# Patient Record
Sex: Female | Born: 1980 | Race: White | Hispanic: No | Marital: Single | Smoking: Never smoker
Health system: Southern US, Community
[De-identification: ages and names within clinical notes are randomized; demographics above are authoritative.]

## PROBLEM LIST (undated history)

## (undated) DIAGNOSIS — F909 Attention-deficit hyperactivity disorder, unspecified type: Secondary | ICD-10-CM

## (undated) DIAGNOSIS — N200 Calculus of kidney: Secondary | ICD-10-CM

## (undated) HISTORY — PX: OTHER SURGICAL HISTORY: SHX169

## (undated) HISTORY — PX: APPENDECTOMY: SHX54

## (undated) HISTORY — PX: CYSTO: SHX6284

## (undated) HISTORY — PX: LAPAROSCOPIC TUBAL LIGATION W/ FILCHIE CLIPS: SHX1938

---

## 2012-12-03 ENCOUNTER — Encounter (HOSPITAL_COMMUNITY): Payer: Self-pay | Admitting: *Deleted

## 2012-12-03 ENCOUNTER — Emergency Department (HOSPITAL_COMMUNITY)
Admission: EM | Admit: 2012-12-03 | Discharge: 2012-12-04 | Disposition: A | Discharge status: Home / Self Care | Payer: Self-pay | Attending: Emergency Medicine | Admitting: Emergency Medicine

## 2012-12-03 DIAGNOSIS — R112 Nausea with vomiting, unspecified: Secondary | ICD-10-CM | POA: Insufficient documentation

## 2012-12-03 DIAGNOSIS — R509 Fever, unspecified: Secondary | ICD-10-CM | POA: Insufficient documentation

## 2012-12-03 DIAGNOSIS — R109 Unspecified abdominal pain: Secondary | ICD-10-CM | POA: Insufficient documentation

## 2012-12-03 DIAGNOSIS — R319 Hematuria, unspecified: Secondary | ICD-10-CM | POA: Insufficient documentation

## 2012-12-03 DIAGNOSIS — R300 Dysuria: Secondary | ICD-10-CM | POA: Insufficient documentation

## 2012-12-03 DIAGNOSIS — Z87442 Personal history of urinary calculi: Secondary | ICD-10-CM | POA: Insufficient documentation

## 2012-12-03 DIAGNOSIS — Z3202 Encounter for pregnancy test, result negative: Secondary | ICD-10-CM | POA: Insufficient documentation

## 2012-12-03 DIAGNOSIS — Z8659 Personal history of other mental and behavioral disorders: Secondary | ICD-10-CM | POA: Insufficient documentation

## 2012-12-03 DIAGNOSIS — Z880 Allergy status to penicillin: Secondary | ICD-10-CM | POA: Insufficient documentation

## 2012-12-03 HISTORY — DX: Attention-deficit hyperactivity disorder, unspecified type: F90.9

## 2012-12-03 HISTORY — DX: Calculus of kidney: N20.0

## 2012-12-03 LAB — URINALYSIS, ROUTINE W REFLEX MICROSCOPIC
Glucose, UA: NEGATIVE mg/dL
Ketones, ur: NEGATIVE mg/dL
Leukocytes, UA: NEGATIVE
Nitrite: NEGATIVE
Protein, ur: NEGATIVE mg/dL
Urobilinogen, UA: 0.2 mg/dL (ref 0.0–1.0)

## 2012-12-03 LAB — URINE MICROSCOPIC-ADD ON

## 2012-12-03 MED ORDER — ONDANSETRON HCL 4 MG/2ML IJ SOLN
4.0000 mg | Freq: Once | INTRAMUSCULAR | Status: AC
  Administered 2012-12-03: 4 mg via INTRAVENOUS
  Filled 2012-12-03: qty 2

## 2012-12-03 MED ORDER — SODIUM CHLORIDE 0.9 % IV BOLUS (SEPSIS)
1000.0000 mL | Freq: Once | INTRAVENOUS | Status: AC
  Administered 2012-12-03: 1000 mL via INTRAVENOUS

## 2012-12-03 MED ORDER — HYDROMORPHONE HCL PF 1 MG/ML IJ SOLN
1.0000 mg | Freq: Once | INTRAMUSCULAR | Status: AC
  Administered 2012-12-03: 1 mg via INTRAVENOUS
  Filled 2012-12-03: qty 1

## 2012-12-03 NOTE — ED Notes (Signed)
Pain rt flank for 2 days with n/v, Hx of kidney stones.

## 2012-12-03 NOTE — ED Provider Notes (Signed)
History    This chart was scribed for Chloe Gaskins, MD by Quintella Reichert, ED scribe.  This patient was seen in room APA06/[facility]06 and the patient's care was started at 11:10 PM.   CSN: [id number]  Arrival date & time 12/03/12  2126      Chief Complaint  Patient presents with  . Flank Pain     Patient is a 32 y.o. female presenting with flank pain. The history is provided by the patient. No language interpreter was used.  Flank Pain This is a new problem. The current episode started 2 days ago. The problem occurs constantly. The problem has been gradually worsening. Pertinent negatives include no chest pain, no abdominal pain, no headaches and no shortness of breath. Nothing aggravates the symptoms. Nothing relieves the symptoms. She has tried nothing for the symptoms. The treatment provided no relief.    HPI Comments: Chloe Howell is a 32 y.o. female with h/o recurrent kidney stones who presents to the Emergency Department complaining of constant, severe right flank pain that began 2 days ago, with accompanying difficulty urinating that began today.  Pt reports that she has had "over a hundred" kidney stones in the past.  She also complains of nausea, emesis, and subjective fever, although she did not take her temperature at home and on admission it is 98.1 F.  She denies diarrhea, vaginal bleeding or discharge, CP, lower abdominal pain, or any other associated symptoms.  Pt has h/o surgery to treat kidney stones but last surgery was over 5 years ago.  Additional surgical history includes appendectomy.  Pt is allergic to Penicillin, Claritin and Sulfa.   Past Medical History  Diagnosis Date  . Kidney stones   . ADHD (attention deficit hyperactivity disorder)     Past Surgical History  Procedure Laterality Date  . Appendectomy    . Cysto      History reviewed. No pertinent family history.  History  Substance Use Topics  . Smoking status: Never Smoker   . Smokeless  tobacco: Not on file  . Alcohol Use: No    OB History   Grav Para Term Preterm Abortions TAB SAB Ect Mult Living                  Review of Systems  Constitutional: Positive for fever (subjective).  Respiratory: Negative for shortness of breath.   Cardiovascular: Negative for chest pain.  Gastrointestinal: Positive for nausea and vomiting. Negative for abdominal pain and diarrhea.  Genitourinary: Positive for flank pain and difficulty urinating. Negative for vaginal bleeding and vaginal discharge.  Neurological: Negative for headaches.  All other systems reviewed and are negative.    Allergies  Claritin; Penicillins; and Sulfa antibiotics  Home Medications  No current outpatient prescriptions on file.  BP 143/95  Pulse 113  Temp(Src) 98.1 F (36.7 C) (Oral)  Resp 20  Ht 5\' 6"  (1.676 m)  Wt 134 lb (60.782 kg)  BMI 21.64 kg/m2  SpO2 100%  LMP 11/18/2012 BP 123/85  Pulse 96  Temp(Src) 98.1 F (36.7 C) (Oral)  Resp 14  Ht 5\' 6"  (1.676 m)  Wt 134 lb (60.782 kg)  BMI 21.64 kg/m2  SpO2 97%  LMP 11/18/2012  Physical Exam  Nursing note and vitals reviewed. CONSTITUTIONAL: Well developed/well nourished, anxious, uncomfortable appearing HEAD: Normocephalic/atraumatic EYES: EOMI/PERRL ENMT: Mucous membranes moist NECK: supple no meningeal signs SPINE:entire spine nontender CV: S1/S2 noted, no murmurs/rubs/gallops noted LUNGS: Lungs are clear to auscultation bilaterally, no apparent distress  ABDOMEN: soft, nontender, no rebound or guarding ZO:XWRUE cva tenderness NEURO: Pt is awake/alert, moves all extremitiesx4 EXTREMITIES: pulses normal, full ROM SKIN: warm, color normal PSYCH: anxious    ED Course  Procedures   DIAGNOSTIC STUDIES: Oxygen Saturation is 100% on room air, normal by my interpretation.    COORDINATION OF CARE: 11:14 PM-Discussed treatment plan which includes UA, pain medication and anti-emetics with pt at bedside and pt agreed to plan.    Hematuria noted without infection.  Renal function appropriate.  Pt feels improved. She has had multiple CT scans previously so will defer imaging now given pt is improved.  Discussed need for f/u with urology as she just relocated here to [facility] from [facility] Reviewed  URINALYSIS, ROUTINE W REFLEX MICROSCOPIC  PREGNANCY, URINE      MDM  Nursing notes including past medical history and social history reviewed and considered in documentation Labs/vital reviewed and considered     I personally performed the services described in this documentation, which was scribed in my presence. The recorded information has been reviewed and is accurate.        Chloe Gaskins, MD 12/04/12 [phone]

## 2012-12-04 LAB — POCT I-STAT, CHEM 8
BUN: 19 mg/dL (ref 6–23)
Chloride: 106 mEq/L (ref 96–112)
Glucose, Bld: 85 mg/dL (ref 70–99)
HCT: 39 % (ref 36.0–46.0)
Potassium: 5.9 mEq/L — ABNORMAL HIGH (ref 3.5–5.1)

## 2012-12-04 MED ORDER — ONDANSETRON HCL 8 MG PO TABS: 8.0000 mg | ORAL_TABLET | Freq: Three times a day (TID) | ORAL | Status: AC | PRN

## 2012-12-04 MED ORDER — KETOROLAC TROMETHAMINE 30 MG/ML IJ SOLN
15.0000 mg | Freq: Once | INTRAMUSCULAR | Status: AC
  Administered 2012-12-04: 15 mg via INTRAVENOUS
  Filled 2012-12-04: qty 1

## 2012-12-04 MED ORDER — HYDROMORPHONE HCL PF 1 MG/ML IJ SOLN
1.0000 mg | Freq: Once | INTRAMUSCULAR | Status: AC
  Administered 2012-12-04: 1 mg via INTRAVENOUS
  Filled 2012-12-04: qty 1

## 2012-12-04 MED ORDER — OXYCODONE-ACETAMINOPHEN 5-325 MG PO TABS: 1.0000 | ORAL_TABLET | ORAL | Status: AC | PRN

## 2012-12-04 NOTE — ED Notes (Signed)
Patient's pain a 2 out of 10. Patient in good condition to leave. Patient given discharge instructions and prescriptions and now patient is in room with sister in laws ED room

## 2012-12-27 ENCOUNTER — Emergency Department (HOSPITAL_COMMUNITY)
Admission: EM | Admit: 2012-12-27 | Discharge: 2012-12-27 | Disposition: A | Discharge status: Home / Self Care | Payer: Self-pay | Attending: Emergency Medicine | Admitting: Emergency Medicine

## 2012-12-27 ENCOUNTER — Encounter (HOSPITAL_COMMUNITY): Payer: Self-pay | Admitting: *Deleted

## 2012-12-27 ENCOUNTER — Emergency Department (HOSPITAL_COMMUNITY): Payer: Self-pay

## 2012-12-27 DIAGNOSIS — Z880 Allergy status to penicillin: Secondary | ICD-10-CM | POA: Insufficient documentation

## 2012-12-27 DIAGNOSIS — Z3202 Encounter for pregnancy test, result negative: Secondary | ICD-10-CM | POA: Insufficient documentation

## 2012-12-27 DIAGNOSIS — Z8659 Personal history of other mental and behavioral disorders: Secondary | ICD-10-CM | POA: Insufficient documentation

## 2012-12-27 DIAGNOSIS — Z9089 Acquired absence of other organs: Secondary | ICD-10-CM | POA: Insufficient documentation

## 2012-12-27 DIAGNOSIS — N200 Calculus of kidney: Secondary | ICD-10-CM | POA: Insufficient documentation

## 2012-12-27 LAB — URINE MICROSCOPIC-ADD ON

## 2012-12-27 LAB — URINALYSIS, ROUTINE W REFLEX MICROSCOPIC
Nitrite: NEGATIVE
Specific Gravity, Urine: 1.01 (ref 1.005–1.030)
Urobilinogen, UA: 0.2 mg/dL (ref 0.0–1.0)

## 2012-12-27 LAB — PREGNANCY, URINE: Preg Test, Ur: NEGATIVE

## 2012-12-27 MED ORDER — HYDROMORPHONE HCL PF 1 MG/ML IJ SOLN
1.0000 mg | Freq: Once | INTRAMUSCULAR | Status: AC
  Administered 2012-12-27: 1 mg via INTRAVENOUS
  Filled 2012-12-27: qty 1

## 2012-12-27 MED ORDER — ONDANSETRON 4 MG PO TBDP: 4.0000 mg | ORAL_TABLET | Freq: Three times a day (TID) | ORAL | Status: AC | PRN

## 2012-12-27 MED ORDER — HYDROCODONE-ACETAMINOPHEN 5-325 MG PO TABS: 1.0000 | ORAL_TABLET | ORAL | Status: AC | PRN

## 2012-12-27 MED ORDER — ONDANSETRON HCL 4 MG/2ML IJ SOLN
4.0000 mg | Freq: Once | INTRAMUSCULAR | Status: AC
  Administered 2012-12-27: 4 mg via INTRAVENOUS
  Filled 2012-12-27: qty 2

## 2012-12-27 MED ORDER — SODIUM CHLORIDE 0.9 % IV BOLUS (SEPSIS)
1000.0000 mL | Freq: Once | INTRAVENOUS | Status: AC
  Administered 2012-12-27: 1000 mL via INTRAVENOUS

## 2012-12-27 NOTE — ED Provider Notes (Signed)
History    CSN: [id number] Arrival date & time 12/27/12  0100  First MD Initiated Contact with Patient 12/27/12 0200     Chief Complaint  Patient presents with  . Flank Pain   (Consider location/radiation/quality/duration/timing/severity/associated sxs/prior Treatment) HPI HPI Comments: Chloe Howell is a 32 y.o. female who presents to the Emergency Department complaining of right flank pain x 2 days and now with abdominal pain. She has a h/o kidney stones, last one passed a month ago. Denies fever, chills, nausea, vomiting. Has not taken any medicines.   PCP Dr. Sudie Bailey  Past Medical History  Diagnosis Date  . Kidney stones   . ADHD (attention deficit hyperactivity disorder)    Past Surgical History  Procedure Laterality Date  . Appendectomy    . Cysto     History reviewed. No pertinent family history. History  Substance Use Topics  . Smoking status: Never Smoker   . Smokeless tobacco: Not on file  . Alcohol Use: No   OB History   Grav Para Term Preterm Abortions TAB SAB Ect Mult Living                 Review of Systems  Constitutional: Negative for fever.       10 Systems reviewed and are negative for acute change except as noted in the HPI.  HENT: Negative for congestion.   Eyes: Negative for discharge and redness.  Respiratory: Negative for cough and shortness of breath.   Cardiovascular: Negative for chest pain.  Gastrointestinal: Positive for abdominal pain. Negative for vomiting.  Genitourinary: Positive for flank pain.  Musculoskeletal: Negative for back pain.  Skin: Negative for rash.  Neurological: Negative for syncope, numbness and headaches.  Psychiatric/Behavioral:       No behavior change.    Allergies  Claritin; Penicillins; and Sulfa antibiotics  Home Medications   Current Outpatient Rx  Name  Route  Sig  Dispense  Refill  . ondansetron (ZOFRAN) 8 MG tablet   Oral   Take 1 tablet (8 mg total) by mouth every 8 (eight) hours as needed  for nausea.   12 tablet   0   . oxyCODONE-acetaminophen (PERCOCET/ROXICET) 5-325 MG per tablet   Oral   Take 1 tablet by mouth every 4 (four) hours as needed for pain.   15 tablet   0    BP 155/65  Pulse 105  Temp(Src) 98.7 F (37.1 C) (Oral)  Resp 20  Ht 5\' 6"  (1.676 m)  Wt 141 lb (63.957 kg)  BMI 22.77 kg/m2  SpO2 96%  LMP 12/09/2012 Physical Exam  Nursing note and vitals reviewed. Constitutional: She is oriented to person, place, and time. She appears well-developed and well-nourished.  Awake, alert, nontoxic appearance.  HENT:  Head: Normocephalic and atraumatic.  Eyes: EOM are normal. Pupils are equal, round, and reactive to light.  Neck: Normal range of motion. Neck supple.  Cardiovascular: Normal rate and intact distal pulses.   Pulmonary/Chest: Effort normal and breath sounds normal. She exhibits no tenderness.  Abdominal: Soft. Bowel sounds are normal. There is no tenderness. There is no rebound.  Genitourinary: Vagina normal.  No cva tenderness  Musculoskeletal: Normal range of motion. She exhibits no tenderness.  Baseline ROM, no obvious new focal weakness.  Neurological: She is alert and oriented to person, place, and time.  Mental status and motor strength appears baseline for patient and situation.  Skin: No rash noted.  Psychiatric: She has a normal mood and affect.  ED Course  Procedures (including critical care time) Results for orders placed during the hospital encounter of 12/27/12  URINALYSIS, ROUTINE W REFLEX MICROSCOPIC      Result Value Range   Color, Urine YELLOW  YELLOW   APPearance CLEAR  CLEAR   Specific Gravity, Urine 1.010  1.005 - 1.030   pH 7.5  5.0 - 8.0   Glucose, UA NEGATIVE  NEGATIVE mg/dL   Hgb urine dipstick LARGE (*) NEGATIVE   Bilirubin Urine NEGATIVE  NEGATIVE   Ketones, ur NEGATIVE  NEGATIVE mg/dL   Protein, ur NEGATIVE  NEGATIVE mg/dL   Urobilinogen, UA 0.2  0.0 - 1.0 mg/dL   Nitrite NEGATIVE  NEGATIVE   Leukocytes,  UA NEGATIVE  NEGATIVE  PREGNANCY, URINE      Result Value Range   Preg Test, Ur NEGATIVE  NEGATIVE  URINE MICROSCOPIC-ADD ON      Result Value Range   Squamous Epithelial / LPF RARE  RARE   WBC, UA 0-2  <3 WBC/hpf   RBC / HPF TOO NUMEROUS TO COUNT  <3 RBC/hpf   Bacteria, UA RARE  RARE   Ct Abdomen Pelvis Wo Contrast  12/27/2012   *RADIOLOGY REPORT*  Clinical Data: Right sided flank pain and hematuria.  CT ABDOMEN AND PELVIS WITHOUT CONTRAST  Technique:  Multidetector CT imaging of the abdomen and pelvis was performed following the standard protocol without intravenous contrast.  Comparison: None.  Findings: Calcified granuloma in the right lung base.  There is in the 2 mm stone in the distal right ureter at the ureterovesicle junction.  There is no proximal pyelocaliectasis or ureterectasis.  No stranding.  There is a punctate sized nonobstructing stone demonstrated in the lower pole of the right kidney.  No stones demonstrated in the left kidney or ureter.  No bladder stones or bladder wall thickening.  The unenhanced appearance of the liver, spleen, gallbladder, pancreas, adrenal glands, abdominal aorta, inferior vena cava, retroperitoneal lymph nodes, stomach, small bowel, and colon is unremarkable.  No free air or free fluid in the abdomen.  Abdominal wall musculature appears intact.  Pelvis:  Uterus and ovaries are not enlarged.  Surgical clips consistent with tubal ligation.  Surgical absence of the appendix. No evidence of diverticulitis.  No free or loculated pelvic fluid collections.  No significant pelvic lymphadenopathy.  Normal alignment of the lumbar vertebrae.  IMPRESSION: 2 mm stone in the distal right ureter without significant proximal obstruction.  Nonobstructing punctate sized stone in the lower pole right kidney.   Original Report Authenticated By: Burman Nieves, M.D.   Medications  sodium chloride 0.9 % bolus 1,000 mL (1,000 mLs Intravenous New Bag/Given 12/27/12 0337)   HYDROmorphone (DILAUDID) injection 1 mg (1 mg Intravenous Given 12/27/12 0221)  ondansetron (ZOFRAN) injection 4 mg (4 mg Intravenous Given 12/27/12 0221)  HYDROmorphone (DILAUDID) injection 1 mg (1 mg Intravenous Given 12/27/12 0337)    MDM  Patient presents with right flank pain and abdominal pain. UA with blood. CT positive for 2mm right ureteral stone at the UVJ. Given dilaudid x 2, and zofran. Reviewed results with the patient. Pt stable in ED with no significant deterioration in condition.The patient appears reasonably screened and/or stabilized for discharge and I doubt any other medical condition or other [facility] requiring further screening, evaluation, or treatment in the ED at this time prior to discharge.  MDM Reviewed: nursing note and vitals Interpretation: labs and CT scan     Nicoletta Dress. Colon Branch, MD 12/27/12 1610

## 2012-12-27 NOTE — ED Notes (Signed)
Pt reporting pain in right flank.  States she has a long history of kidney stones, and believes she is passing another.

## 2013-01-17 ENCOUNTER — Emergency Department (HOSPITAL_COMMUNITY)
Admission: EM | Admit: 2013-01-17 | Discharge: 2013-01-17 | Disposition: A | Discharge status: Home / Self Care | Payer: Self-pay | Attending: Emergency Medicine | Admitting: Emergency Medicine

## 2013-01-17 ENCOUNTER — Encounter (HOSPITAL_COMMUNITY): Payer: Self-pay | Admitting: *Deleted

## 2013-01-17 DIAGNOSIS — R112 Nausea with vomiting, unspecified: Secondary | ICD-10-CM | POA: Insufficient documentation

## 2013-01-17 DIAGNOSIS — Z3202 Encounter for pregnancy test, result negative: Secondary | ICD-10-CM | POA: Insufficient documentation

## 2013-01-17 DIAGNOSIS — Z9089 Acquired absence of other organs: Secondary | ICD-10-CM | POA: Insufficient documentation

## 2013-01-17 DIAGNOSIS — Z8742 Personal history of other diseases of the female genital tract: Secondary | ICD-10-CM | POA: Insufficient documentation

## 2013-01-17 DIAGNOSIS — Z8659 Personal history of other mental and behavioral disorders: Secondary | ICD-10-CM | POA: Insufficient documentation

## 2013-01-17 DIAGNOSIS — Z9889 Other specified postprocedural states: Secondary | ICD-10-CM | POA: Insufficient documentation

## 2013-01-17 DIAGNOSIS — Z880 Allergy status to penicillin: Secondary | ICD-10-CM | POA: Insufficient documentation

## 2013-01-17 DIAGNOSIS — R350 Frequency of micturition: Secondary | ICD-10-CM | POA: Insufficient documentation

## 2013-01-17 DIAGNOSIS — N201 Calculus of ureter: Secondary | ICD-10-CM | POA: Insufficient documentation

## 2013-01-17 DIAGNOSIS — Z79899 Other long term (current) drug therapy: Secondary | ICD-10-CM | POA: Insufficient documentation

## 2013-01-17 LAB — URINE MICROSCOPIC-ADD ON

## 2013-01-17 LAB — URINALYSIS, ROUTINE W REFLEX MICROSCOPIC
Bilirubin Urine: NEGATIVE
Glucose, UA: NEGATIVE mg/dL
Ketones, ur: NEGATIVE mg/dL
pH: 6.5 (ref 5.0–8.0)

## 2013-01-17 MED ORDER — HYDROMORPHONE HCL PF 1 MG/ML IJ SOLN
0.5000 mg | Freq: Once | INTRAMUSCULAR | Status: AC
  Administered 2013-01-17: 0.5 mg via INTRAVENOUS
  Filled 2013-01-17: qty 1

## 2013-01-17 MED ORDER — PROMETHAZINE HCL 25 MG PO TABS: 25.0000 mg | ORAL_TABLET | Freq: Four times a day (QID) | ORAL | Status: AC | PRN

## 2013-01-17 MED ORDER — HYDROMORPHONE HCL PF 1 MG/ML IJ SOLN
INTRAMUSCULAR | Status: AC
  Filled 2013-01-17: qty 1

## 2013-01-17 MED ORDER — OXYCODONE-ACETAMINOPHEN 5-325 MG PO TABS: 1.0000 | ORAL_TABLET | Freq: Four times a day (QID) | ORAL | Status: AC | PRN

## 2013-01-17 MED ORDER — HYDROMORPHONE HCL PF 1 MG/ML IJ SOLN
0.5000 mg | Freq: Once | INTRAMUSCULAR | Status: AC
  Administered 2013-01-17: 0.5 mg via INTRAVENOUS

## 2013-01-17 MED ORDER — ONDANSETRON HCL 4 MG/2ML IJ SOLN
4.0000 mg | Freq: Once | INTRAMUSCULAR | Status: AC
  Administered 2013-01-17: 4 mg via INTRAVENOUS
  Filled 2013-01-17: qty 2

## 2013-01-17 MED ORDER — KETOROLAC TROMETHAMINE 30 MG/ML IJ SOLN
30.0000 mg | Freq: Once | INTRAMUSCULAR | Status: AC
  Administered 2013-01-17: 30 mg via INTRAVENOUS
  Filled 2013-01-17: qty 1

## 2013-01-17 NOTE — ED Provider Notes (Signed)
CSN: [id number]     Arrival date & time 01/17/13  0045 History     First MD Initiated Contact with Patient 01/17/13 0134     Chief Complaint  Patient presents with  . Flank Pain    rt side  . Nausea  . Emesis   (Consider location/radiation/quality/duration/timing/severity/associated sxs/prior Treatment) Patient is a 32 y.o. female presenting with flank pain and vomiting.  Flank Pain This is a recurrent problem. Pertinent negatives include no chest pain, no abdominal pain, no headaches and no shortness of breath.  Emesis Associated symptoms: no abdominal pain, no diarrhea and no headaches    patient with right flank pain and nausea with vomiting. She has a history of a "100" kidney stones. She was recently seen for the same and had a CT scan that showed a residual punctate stone in the right kidney. No fevers. She has had urinary frequency. No vaginal bleeding or discharge. She denies possibility of pregnancy. No previous renal insufficiency. She has an appointment with Dr. Jerre Simon in 2 weeks  Past Medical History  Diagnosis Date  . Kidney stones   . ADHD (attention deficit hyperactivity disorder)    Past Surgical History  Procedure Laterality Date  . Appendectomy    . Cysto     History reviewed. No pertinent family history. History  Substance Use Topics  . Smoking status: Never Smoker   . Smokeless tobacco: Not on file  . Alcohol Use: No   OB History   Grav Para Term Preterm Abortions TAB SAB Ect Mult Living                 Review of Systems  Constitutional: Negative for fever, activity change, appetite change and fatigue.  HENT: Negative for neck stiffness.   Eyes: Negative for pain.  Respiratory: Negative for chest tightness and shortness of breath.   Cardiovascular: Negative for chest pain and leg swelling.  Gastrointestinal: Positive for nausea and vomiting. Negative for abdominal pain and diarrhea.  Genitourinary: Positive for frequency and flank pain. Negative  for urgency, decreased urine volume, vaginal bleeding and pelvic pain.  Musculoskeletal: Negative for back pain.  Skin: Negative for rash.  Neurological: Negative for weakness, numbness and headaches.  Psychiatric/Behavioral: Negative for behavioral problems.    Allergies  Claritin; Penicillins; and Sulfa antibiotics  Home Medications   Current Outpatient Rx  Name  Route  Sig  Dispense  Refill  . HYDROcodone-acetaminophen (NORCO/VICODIN) 5-325 MG per tablet   Oral   Take 1 tablet by mouth every 4 (four) hours as needed for pain.   15 tablet   0   . ondansetron (ZOFRAN ODT) 4 MG disintegrating tablet   Oral   Take 1 tablet (4 mg total) by mouth every 8 (eight) hours as needed for nausea.   20 tablet   0   . ondansetron (ZOFRAN) 8 MG tablet   Oral   Take 1 tablet (8 mg total) by mouth every 8 (eight) hours as needed for nausea.   12 tablet   0   . oxyCODONE-acetaminophen (PERCOCET/ROXICET) 5-325 MG per tablet   Oral   Take 1 tablet by mouth every 4 (four) hours as needed for pain.   15 tablet   0   . oxyCODONE-acetaminophen (PERCOCET/ROXICET) 5-325 MG per tablet   Oral   Take 1-2 tablets by mouth every 6 (six) hours as needed for pain.   10 tablet   0   . promethazine (PHENERGAN) 25 MG tablet   Oral  Take 1 tablet (25 mg total) by mouth every 6 (six) hours as needed for nausea.   20 tablet   0    BP 139/87  Pulse 99  Temp(Src) 98.5 F (36.9 C) (Oral)  Resp 24  Ht 5\' 6"  (1.676 m)  Wt 140 lb (63.504 kg)  BMI 22.61 kg/m2  SpO2 100%  LMP 12/13/2012 Physical Exam  Nursing note and vitals reviewed. Constitutional: She is oriented to person, place, and time. She appears well-developed and well-nourished.  Patient appears somewhat uncomfortable  HENT:  Head: Normocephalic and atraumatic.  Eyes: EOM are normal. Pupils are equal, round, and reactive to light.  Neck: Normal range of motion. Neck supple.  Cardiovascular: Normal rate, regular rhythm and normal  heart sounds.   No murmur heard. Pulmonary/Chest: Effort normal and breath sounds normal. No respiratory distress. She has no wheezes. She has no rales.  Abdominal: Soft. Bowel sounds are normal. She exhibits no distension. There is no tenderness. There is no rebound and no guarding.  Genitourinary:  CVA tenderness on right  Musculoskeletal: Normal range of motion.  Neurological: She is alert and oriented to person, place, and time. No cranial nerve deficit.  Skin: Skin is warm and dry.  Psychiatric: She has a normal mood and affect. Her speech is normal.    ED Course   Procedures (including critical care time)  Labs Reviewed  URINALYSIS, ROUTINE W REFLEX MICROSCOPIC - Abnormal; Notable for the following:    Specific Gravity, Urine <1.005 (*)    Hgb urine dipstick LARGE (*)    All other components within normal limits  PREGNANCY, URINE  URINE MICROSCOPIC-ADD ON   No results found. 1. Right ureteral stone     MDM  Patient with likely right ureteral stone. He had a residual stone on previous recent CT. She has hematuria without infection. Pain is improved with treatment. she'll be discharged home and has urologic follow  Juliet Rude. Rubin Payor, MD 01/17/13 [id number]

## 2013-01-17 NOTE — ED Notes (Signed)
Pt co rt flank pain, has hx of chronic kidney stones, pain started last night

## 2013-11-28 ENCOUNTER — Encounter (HOSPITAL_COMMUNITY): Payer: Self-pay | Admitting: Emergency Medicine

## 2013-11-28 ENCOUNTER — Emergency Department (HOSPITAL_COMMUNITY)
Admission: EM | Admit: 2013-11-28 | Discharge: 2013-11-29 | Disposition: A | Discharge status: Home / Self Care | Payer: Self-pay | Attending: Emergency Medicine | Admitting: Emergency Medicine

## 2013-11-28 DIAGNOSIS — Z880 Allergy status to penicillin: Secondary | ICD-10-CM | POA: Insufficient documentation

## 2013-11-28 DIAGNOSIS — R112 Nausea with vomiting, unspecified: Secondary | ICD-10-CM | POA: Insufficient documentation

## 2013-11-28 DIAGNOSIS — Z3202 Encounter for pregnancy test, result negative: Secondary | ICD-10-CM | POA: Insufficient documentation

## 2013-11-28 DIAGNOSIS — R000 Tachycardia, unspecified: Secondary | ICD-10-CM | POA: Insufficient documentation

## 2013-11-28 DIAGNOSIS — R109 Unspecified abdominal pain: Secondary | ICD-10-CM

## 2013-11-28 DIAGNOSIS — Z87442 Personal history of urinary calculi: Secondary | ICD-10-CM

## 2013-11-28 DIAGNOSIS — Z8659 Personal history of other mental and behavioral disorders: Secondary | ICD-10-CM | POA: Insufficient documentation

## 2013-11-28 DIAGNOSIS — R319 Hematuria, unspecified: Secondary | ICD-10-CM

## 2013-11-28 DIAGNOSIS — Z79899 Other long term (current) drug therapy: Secondary | ICD-10-CM | POA: Insufficient documentation

## 2013-11-28 LAB — URINALYSIS, ROUTINE W REFLEX MICROSCOPIC
Bilirubin Urine: NEGATIVE
GLUCOSE, UA: NEGATIVE mg/dL
Ketones, ur: 15 mg/dL — AB
Leukocytes, UA: NEGATIVE
Nitrite: NEGATIVE
PH: 6.5 (ref 5.0–8.0)
PROTEIN: NEGATIVE mg/dL
Specific Gravity, Urine: 1.025 (ref 1.005–1.030)
Urobilinogen, UA: 0.2 mg/dL (ref 0.0–1.0)

## 2013-11-28 LAB — POC URINE PREG, ED: Preg Test, Ur: NEGATIVE

## 2013-11-28 LAB — CBC WITH DIFFERENTIAL/PLATELET
Basophils Absolute: 0 10*3/uL (ref 0.0–0.1)
Basophils Relative: 0 % (ref 0–1)
Eosinophils Absolute: 0 10*3/uL (ref 0.0–0.7)
Eosinophils Relative: 0 % (ref 0–5)
HEMATOCRIT: 40.2 % (ref 36.0–46.0)
Hemoglobin: 14 g/dL (ref 12.0–15.0)
LYMPHS ABS: 2.3 10*3/uL (ref 0.7–4.0)
LYMPHS PCT: 17 % (ref 12–46)
MCH: 33.3 pg (ref 26.0–34.0)
MCHC: 34.8 g/dL (ref 30.0–36.0)
MCV: 95.5 fL (ref 78.0–100.0)
MONO ABS: 0.7 10*3/uL (ref 0.1–1.0)
Monocytes Relative: 5 % (ref 3–12)
Neutro Abs: 10.8 10*3/uL — ABNORMAL HIGH (ref 1.7–7.7)
Neutrophils Relative %: 78 % — ABNORMAL HIGH (ref 43–77)
Platelets: 260 10*3/uL (ref 150–400)
RBC: 4.21 MIL/uL (ref 3.87–5.11)
RDW: 13.5 % (ref 11.5–15.5)
WBC: 14 10*3/uL — AB (ref 4.0–10.5)

## 2013-11-28 LAB — URINE MICROSCOPIC-ADD ON

## 2013-11-28 LAB — BASIC METABOLIC PANEL
BUN: 14 mg/dL (ref 6–23)
CALCIUM: 9.6 mg/dL (ref 8.4–10.5)
CHLORIDE: 102 meq/L (ref 96–112)
CO2: 25 meq/L (ref 19–32)
CREATININE: 0.72 mg/dL (ref 0.50–1.10)
GFR calc Af Amer: >90 mL/min (ref >90)
GFR calc non Af Amer: >90 mL/min (ref >90)
Glucose, Bld: 122 mg/dL — ABNORMAL HIGH (ref 70–99)
Potassium: 3.8 mEq/L (ref 3.7–5.3)
Sodium: 141 mEq/L (ref 137–147)

## 2013-11-28 MED ORDER — SODIUM CHLORIDE 0.9 % IV SOLN
INTRAVENOUS | Status: DC
  Administered 2013-11-28: 23:00:00 via INTRAVENOUS

## 2013-11-28 MED ORDER — KETOROLAC TROMETHAMINE 60 MG/2ML IM SOLN: 60.0000 mg | Freq: Once | INTRAMUSCULAR | Status: DC

## 2013-11-28 MED ORDER — ONDANSETRON HCL 4 MG/2ML IJ SOLN
4.0000 mg | Freq: Once | INTRAMUSCULAR | Status: AC
  Administered 2013-11-28: 4 mg via INTRAMUSCULAR
  Filled 2013-11-28: qty 2

## 2013-11-28 MED ORDER — HYDROMORPHONE HCL PF 1 MG/ML IJ SOLN
1.0000 mg | Freq: Once | INTRAMUSCULAR | Status: AC
  Administered 2013-11-28: 1 mg via INTRAVENOUS
  Filled 2013-11-28: qty 1

## 2013-11-28 MED ORDER — HYDROMORPHONE HCL PF 1 MG/ML IJ SOLN
1.0000 mg | Freq: Once | INTRAMUSCULAR | Status: AC
  Administered 2013-11-29: 1 mg via INTRAVENOUS
  Filled 2013-11-28: qty 1

## 2013-11-28 MED ORDER — ONDANSETRON HCL 4 MG/2ML IJ SOLN
INTRAMUSCULAR | Status: AC
  Administered 2013-11-28: 4 mg
  Filled 2013-11-28: qty 2

## 2013-11-28 MED ORDER — KETOROLAC TROMETHAMINE 30 MG/ML IJ SOLN
30.0000 mg | Freq: Once | INTRAMUSCULAR | Status: AC
  Administered 2013-11-28: 30 mg via INTRAVENOUS
  Filled 2013-11-28: qty 1

## 2013-11-28 MED ORDER — ONDANSETRON HCL 4 MG/2ML IJ SOLN: 4.0000 mg | Freq: Once | INTRAMUSCULAR | Status: DC

## 2013-11-28 NOTE — ED Notes (Signed)
Pain in R upper abdomen 2 days ago intermittently.  Tonight pain became severe in R pelvis and began having blood in urine.  Hx of multiple renal stones.

## 2013-11-28 NOTE — ED Provider Notes (Signed)
CSN: [id number]     Arrival date & time 11/28/13  2234 History   First MD Initiated Contact with Patient 11/28/13 2246     Chief Complaint  Patient presents with  . Pelvic Pain     (Consider location/radiation/quality/duration/timing/severity/associated sxs/prior Treatment) Patient is a 33 y.o. female presenting with pelvic pain. The history is provided by the patient.  Pelvic Pain This is a new problem. The current episode started in the past 7 days. The problem occurs constantly. The problem has been gradually worsening. Associated symptoms include abdominal pain, nausea and vomiting.   Chloe Howell is a 33 y.o. female who presents to the ED with abdominal pain that started 2 days ago. The pain started in the upper abdomen and tonight she states that the pain is in the right pelvic area that radiates to the right flank area. Unable to get comfortable.  She has noted blood in her urine. She has a history of kidney stones. Her last CT scan was in March and showed 14 stones. She just moved to [place] 2 weeks ago and does not have a Insurance underwriterUrologist here yet. PMH significant for Renal failure during pregnancy in 2003.   Past Medical History  Diagnosis Date  . Kidney stones   . ADHD (attention deficit hyperactivity disorder)    Past Surgical History  Procedure Laterality Date  . Appendectomy    . Cysto    . Dilatation and curettage    . Laparoscopic tubal ligation w/ filchie clips     History reviewed. No pertinent family history. History  Substance Use Topics  . Smoking status: Never Smoker   . Smokeless tobacco: Not on file  . Alcohol Use: No   OB History   Grav Para Term Preterm Abortions TAB SAB Ect Mult Living                 Review of Systems  Gastrointestinal: Positive for nausea, vomiting and abdominal pain.  Genitourinary: Positive for flank pain and pelvic pain.  All other systems negative    Allergies  Claritin; Penicillins; and Sulfa antibiotics  Home  Medications   Prior to Admission medications   Medication Sig Start Date End Date Taking? Authorizing Provider  HYDROcodone-acetaminophen (NORCO/VICODIN) 5-325 MG per tablet Take 1 tablet by mouth every 4 (four) hours as needed for pain. 12/27/12   Annamarie Dawleyerry S Strand, MD  ondansetron (ZOFRAN ODT) 4 MG disintegrating tablet Take 1 tablet (4 mg total) by mouth every 8 (eight) hours as needed for nausea. 12/27/12   Annamarie Dawleyerry S Strand, MD  ondansetron (ZOFRAN) 8 MG tablet Take 1 tablet (8 mg total) by mouth every 8 (eight) hours as needed for nausea. 12/04/12   Joya Gaskinsonald W Wickline, MD  oxyCODONE-acetaminophen (PERCOCET/ROXICET) 5-325 MG per tablet Take 1 tablet by mouth every 4 (four) hours as needed for pain. 12/04/12   Joya Gaskinsonald W Wickline, MD  oxyCODONE-acetaminophen (PERCOCET/ROXICET) 5-325 MG per tablet Take 1-2 tablets by mouth every 6 (six) hours as needed for pain. 01/17/13   Juliet RudeNathan R. Rubin PayorPickering, MD  promethazine (PHENERGAN) 25 MG tablet Take 1 tablet (25 mg total) by mouth every 6 (six) hours as needed for nausea. 01/17/13   Juliet RudeNathan R. Pickering, MD   BP 141/90  Pulse 111  Temp(Src) 97.9 F (36.6 C)  Resp 20  Ht 5\' 6"  (1.676 m)  Wt 140 lb (63.504 kg)  BMI 22.61 kg/m2  SpO2 100%  LMP 11/06/2013 Physical Exam  Nursing note and vitals reviewed. Constitutional: She is  oriented to person, place, and time. She appears well-developed and well-nourished. No distress.  Patient appears uncomfortable. Continues to move from one side to the other and then on her hands and knees.   HENT:  Head: Normocephalic and atraumatic.  Eyes: EOM are normal.  Neck: Neck supple.  Cardiovascular: Tachycardia present.   Pulmonary/Chest: Effort normal.  Abdominal: Soft. There is tenderness in the suprapubic area.  Right flank.  Musculoskeletal: Normal range of motion.  Neurological: She is alert and oriented to person, place, and time. No cranial nerve deficit.  Skin: Skin is warm and dry.  Psychiatric: She has a normal mood  and affect. Her behavior is normal.    ED Course: IV hydration, Toradol, Zofran, Dilaudid, Phenergan  Procedures ( Patient improved after medications but continues to have some pain. States she is ready to go home and take PO medications and request a Urology referral.  Results for orders placed during the hospital encounter of 11/28/13 (from the past 24 hour(s))  URINALYSIS, ROUTINE W REFLEX MICROSCOPIC     Status: Abnormal   Collection Time    11/28/13 10:50 PM      Result Value Ref Range   Color, Urine AMBER (*) YELLOW   APPearance CLOUDY (*) CLEAR   Specific Gravity, Urine 1.025  1.005 - 1.030   pH 6.5  5.0 - 8.0   Glucose, UA NEGATIVE  NEGATIVE mg/dL   Hgb urine dipstick LARGE (*) NEGATIVE   Bilirubin Urine NEGATIVE  NEGATIVE   Ketones, ur 15 (*) NEGATIVE mg/dL   Protein, ur NEGATIVE  NEGATIVE mg/dL   Urobilinogen, UA 0.2  0.0 - 1.0 mg/dL   Nitrite NEGATIVE  NEGATIVE   Leukocytes, UA NEGATIVE  NEGATIVE  URINE MICROSCOPIC-ADD ON     Status: Abnormal   Collection Time    11/28/13 10:50 PM      Result Value Ref Range   Squamous Epithelial / LPF FEW (*) RARE   WBC, UA 3-6  <3 WBC/hpf   RBC / HPF TOO NUMEROUS TO COUNT  <3 RBC/hpf   Bacteria, UA FEW (*) RARE  POC URINE PREG, ED     Status: None   Collection Time    11/28/13 10:58 PM      Result Value Ref Range   Preg Test, Ur NEGATIVE  NEGATIVE  CBC WITH DIFFERENTIAL     Status: Abnormal   Collection Time    11/28/13 11:04 PM      Result Value Ref Range   WBC 14.0 (*) 4.0 - 10.5 K/uL   RBC 4.21  3.87 - 5.11 MIL/uL   Hemoglobin 14.0  12.0 - 15.0 g/dL   HCT 11.9  14.7 - 82.9 %   MCV 95.5  78.0 - 100.0 fL   MCH 33.3  26.0 - 34.0 pg   MCHC 34.8  30.0 - 36.0 g/dL   RDW 56.2  13.0 - 86.5 %   Platelets 260  150 - 400 K/uL   Neutrophils Relative % 78 (*) 43 - 77 %   Neutro Abs 10.8 (*) 1.7 - 7.7 K/uL   Lymphocytes Relative 17  12 - 46 %   Lymphs Abs 2.3  0.7 - 4.0 K/uL   Monocytes Relative 5  3 - 12 %   Monocytes Absolute  0.7  0.1 - 1.0 K/uL   Eosinophils Relative 0  0 - 5 %   Eosinophils Absolute 0.0  0.0 - 0.7 K/uL   Basophils Relative 0  0 - 1 %  Basophils Absolute 0.0  0.0 - 0.1 K/uL  BASIC METABOLIC PANEL     Status: Abnormal   Collection Time    11/28/13 11:04 PM      Result Value Ref Range   Sodium 141  137 - 147 mEq/L   Potassium 3.8  3.7 - 5.3 mEq/L   Chloride 102  96 - 112 mEq/L   CO2 25  19 - 32 mEq/L   Glucose, Bld 122 (*) 70 - 99 mg/dL   BUN 14  6 - 23 mg/dL   Creatinine, Ser 1.610.72  0.50 - 1.10 mg/dL   Calcium 9.6  8.4 - 09.610.5 mg/dL   GFR calc non Af Amer >90  >90 mL/min   GFR calc Af Amer >90  >90 mL/min    MDM  33 y.o. female with flank pain and hematuria and hx of kidney stones. Last CT scan a few months ago. Patient improved with IV hydration, medication for nausea and pain. Stable for discharge home with urine strainer, pain medication and Phenergan for nausea. She will follow up with Urology or return here as needed for worsening symptoms.  Discussed with the patient and all questioned fully answered.    Medication List    ASK your doctor about these medications       HYDROcodone-acetaminophen 5-325 MG per tablet  Commonly known as:  NORCO/VICODIN  Take 1 tablet by mouth every 4 (four) hours as needed for pain.     ondansetron 4 MG disintegrating tablet  Commonly known as:  ZOFRAN ODT  Take 1 tablet (4 mg total) by mouth every 8 (eight) hours as needed for nausea.     ondansetron 8 MG tablet  Commonly known as:  ZOFRAN  Take 1 tablet (8 mg total) by mouth every 8 (eight) hours as needed for nausea.     oxyCODONE-acetaminophen 5-325 MG per tablet  Commonly known as:  PERCOCET/ROXICET  Take 1 tablet by mouth every 4 (four) hours as needed for pain.  Ask about: Which instructions should I use?     oxyCODONE-acetaminophen 5-325 MG per tablet  Commonly known as:  PERCOCET/ROXICET  Take 1-2 tablets by mouth every 6 (six) hours as needed for pain.  Ask about: Which  instructions should I use?     oxyCODONE-acetaminophen 5-325 MG per tablet  Commonly known as:  PERCOCET/ROXICET  Take 1 tablet by mouth every 4 (four) hours as needed for moderate pain or severe pain.  Ask about: Which instructions should I use?     oxyCODONE-acetaminophen 5-325 MG per tablet  Commonly known as:  ROXICET  Take 1 tablet by mouth every 4 (four) hours as needed for severe pain.  Ask about: Which instructions should I use?     promethazine 25 MG tablet  Commonly known as:  PHENERGAN  Take 1 tablet (25 mg total) by mouth every 6 (six) hours as needed for nausea.  Ask about: Which instructions should I use?     promethazine 25 MG tablet  Commonly known as:  PHENERGAN  Take 1 tablet (25 mg total) by mouth every 6 (six) hours as needed for nausea or vomiting.  Ask about: Which instructions should I use?         [facility] Orlene OchM Davyon Fisch, [place]NP 11/29/13 [phone]

## 2013-11-29 MED ORDER — OXYCODONE-ACETAMINOPHEN 5-325 MG PO TABS: 1.0000 | ORAL_TABLET | ORAL | Status: AC | PRN

## 2013-11-29 MED ORDER — PROMETHAZINE HCL 25 MG PO TABS: 25.0000 mg | ORAL_TABLET | Freq: Four times a day (QID) | ORAL | Status: AC | PRN

## 2013-11-29 MED ORDER — PROMETHAZINE HCL 25 MG/ML IJ SOLN
INTRAMUSCULAR | Status: AC
  Filled 2013-11-29: qty 1

## 2013-11-29 MED ORDER — PROMETHAZINE HCL 25 MG/ML IJ SOLN
12.5000 mg | Freq: Once | INTRAMUSCULAR | Status: AC
  Administered 2013-11-29: 12.5 mg via INTRAVENOUS

## 2013-11-29 NOTE — ED Notes (Signed)
Pt's percocet prepak filled in the ED. Pt verbalized understanding of administration instructions

## 2013-11-29 NOTE — Discharge Instructions (Signed)
Take the medication as directed. Do not take the medication if you are driving as it will make you sleepy. Follow up with the Urologist. Return here as needed.

## 2013-11-29 NOTE — ED Notes (Addendum)
Pt vomiting. Hope, NP made aware

## 2013-11-30 MED FILL — Oxycodone w/ Acetaminophen Tab 5-325 MG: ORAL | Qty: 6 | Status: AC

## 2013-11-30 NOTE — ED Provider Notes (Signed)
Medical screening examination/treatment/procedure(s) were performed by non-physician practitioner and as supervising physician I was immediately available for consultation/collaboration.   EKG Interpretation None        Joya Gaskinsonald W Kristi Norment, MD 11/30/13 [phone]

## 2015-01-06 IMAGING — CT CT ABD-PELV W/O CM
2 of 3 series · 9 of 46 positions shown, 11 images · non-contrast
Comparison: None.

CLINICAL DATA: Right sided flank pain and hematuria.

CT ABDOMEN AND PELVIS WITHOUT CONTRAST
TECHNIQUE: Multidetector CT imaging of the abdomen and pelvis was
performed following the standard protocol without intravenous
contrast.

[Series 4: mpr coronal (id) · coronal · 0.68mm/px · 8 of 102 slices shown, 9 images]
[im 12/102  soft-tissue]
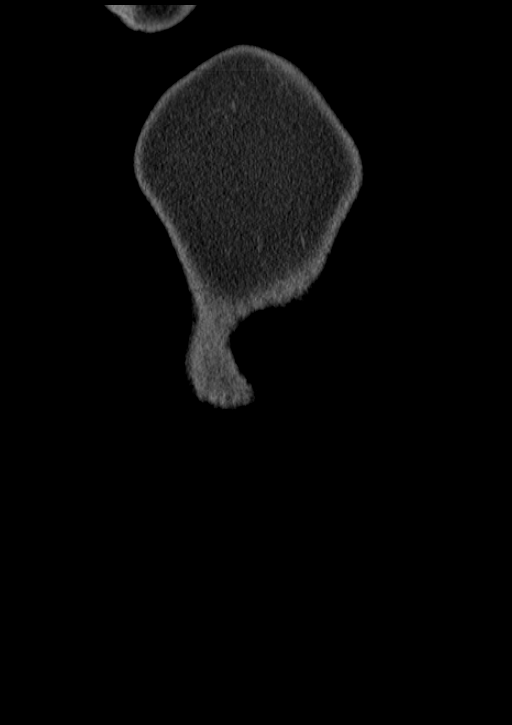
[im 12/102  bone]
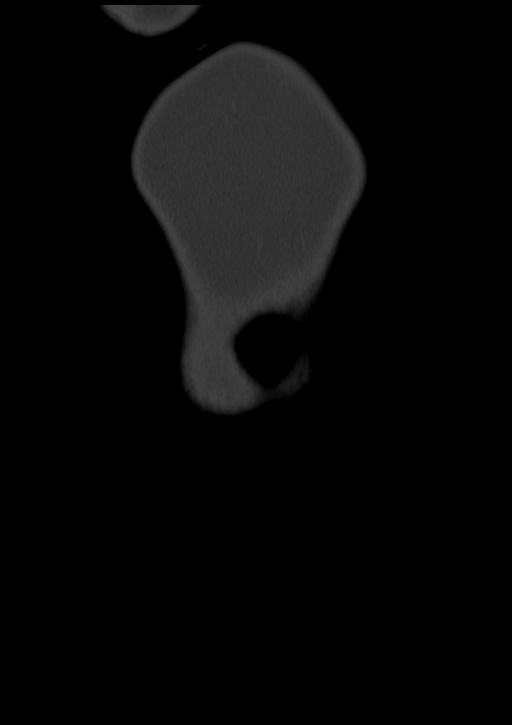
[im 23/102  soft-tissue]
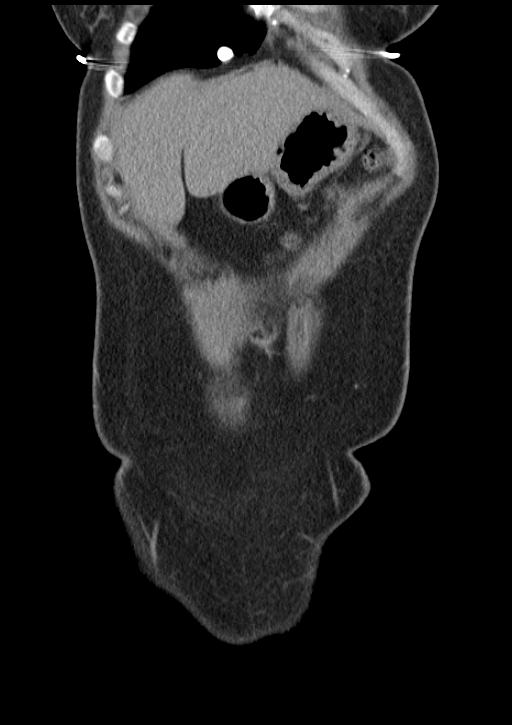
[im 34/102  soft-tissue]
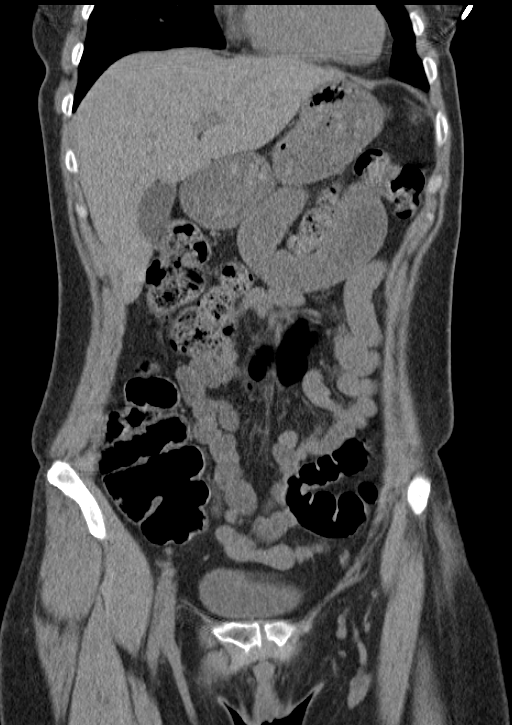
[im 45/102  soft-tissue]
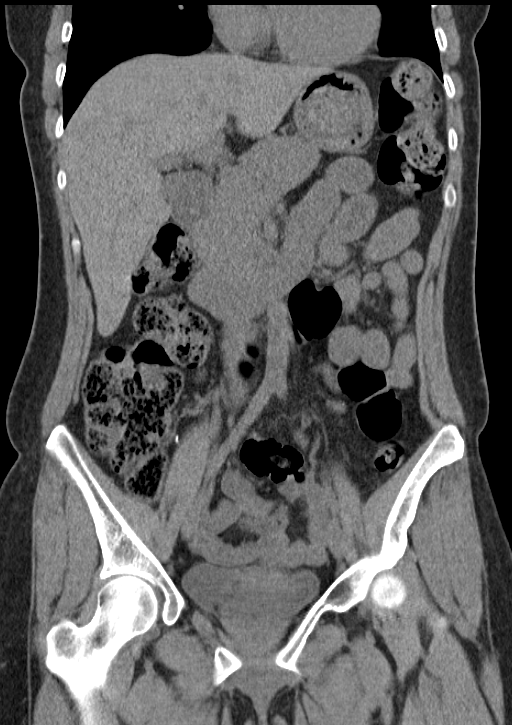
[im 57/102  soft-tissue]
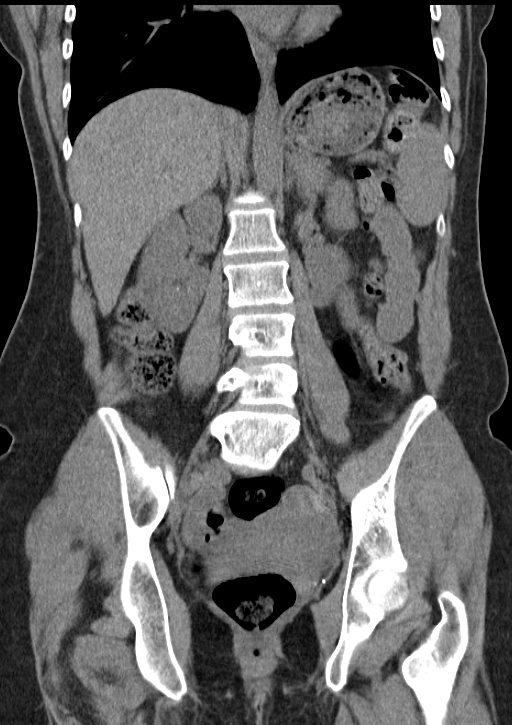
[im 68/102  soft-tissue]
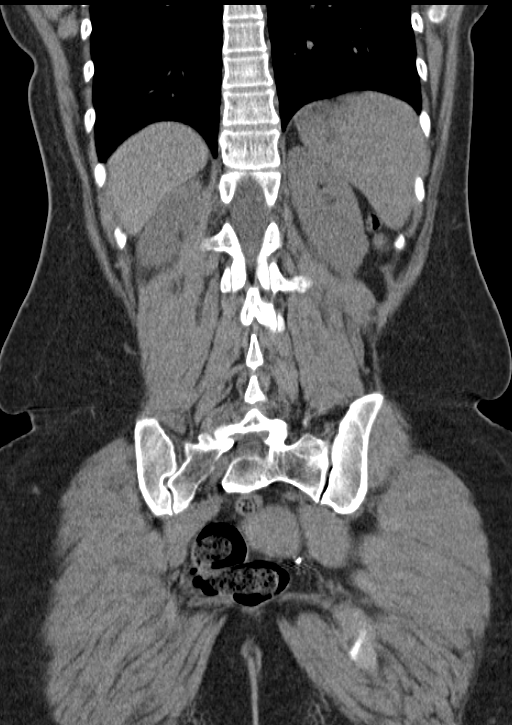
[im 79/102  soft-tissue]
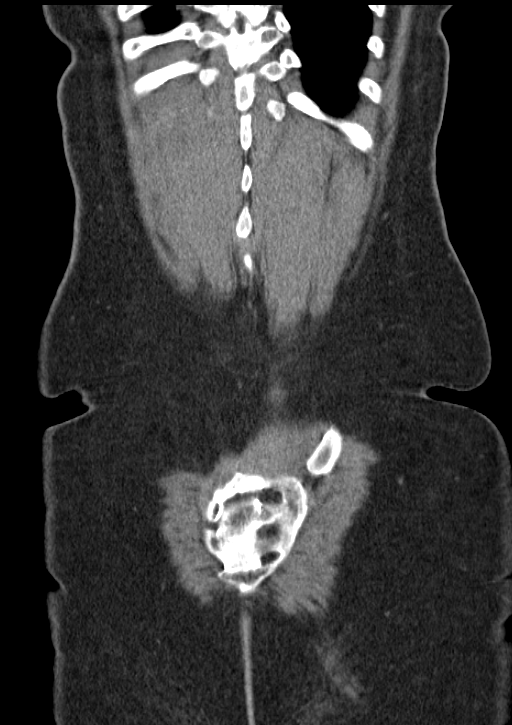
[im 90/102  soft-tissue]
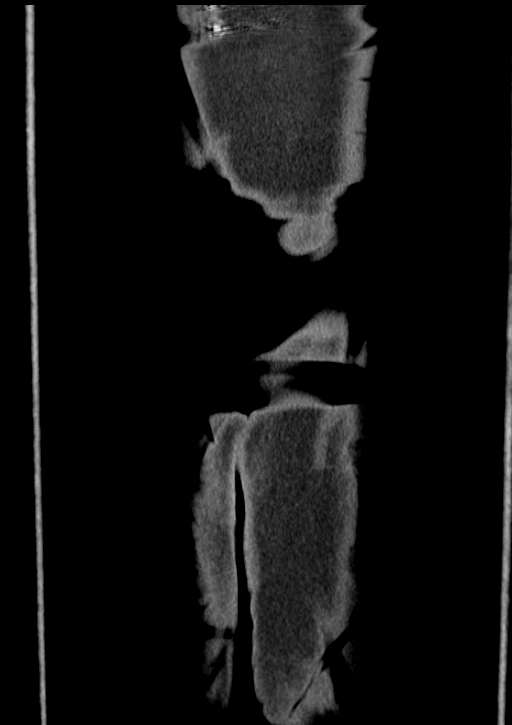

[Series 5: mpr sagittal (id) · sagittal · 0.52mm/px · 1 of 112 slices shown, 2 images]
[im 38/112  soft-tissue]
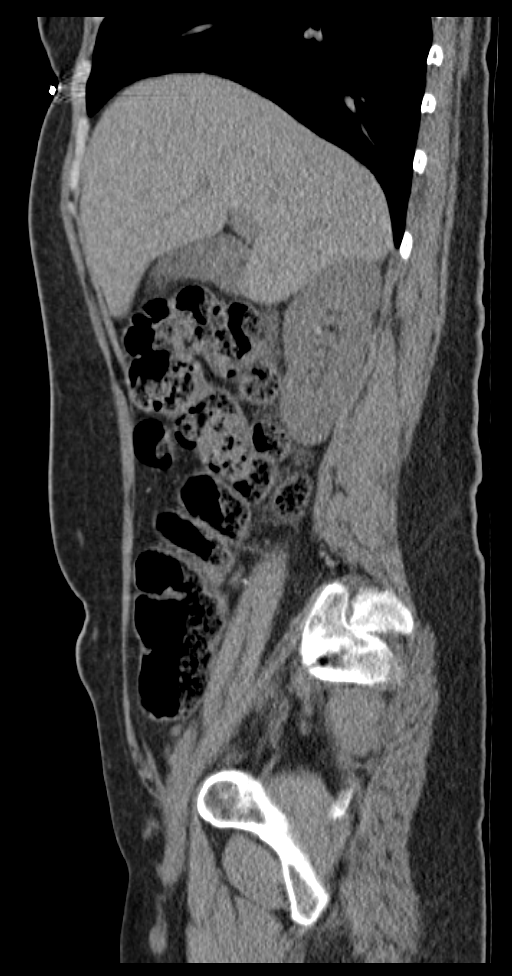
[im 38/112  bone]
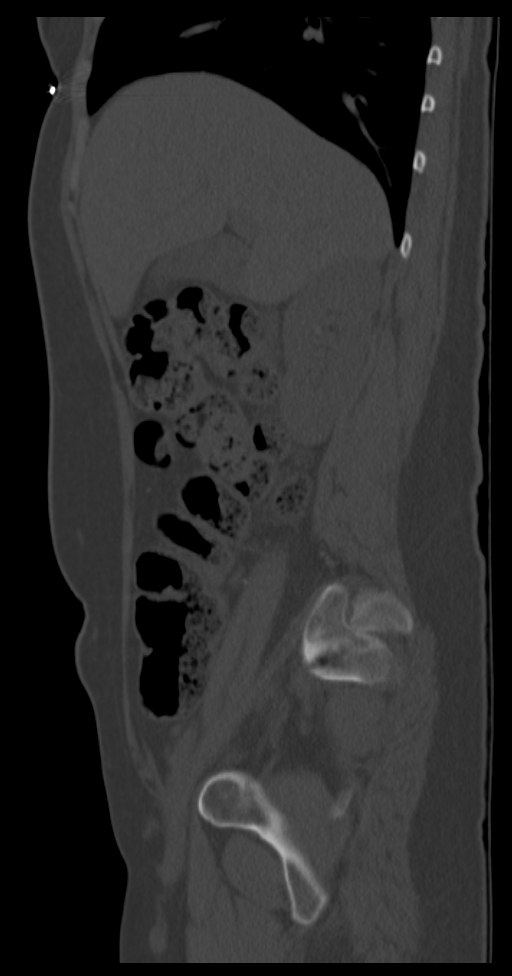

[9 of 46 positions shown; findings below may reference images not displayed]

FINDINGS: Calcified granuloma in the right lung base.

There is in the 2 mm stone in the distal right ureter at the
ureterovesicle junction.  There is no proximal pyelocaliectasis or
ureterectasis.  No stranding.  There is a punctate sized
nonobstructing stone demonstrated in the lower pole of the right
kidney.  No stones demonstrated in the left kidney or ureter.  No
bladder stones or bladder wall thickening.

The unenhanced appearance of the liver, spleen, gallbladder,
pancreas, adrenal glands, abdominal aorta, inferior vena cava,
retroperitoneal lymph nodes, stomach, small bowel, and colon is
unremarkable.  No free air or free fluid in the abdomen.  Abdominal
wall musculature appears intact.

Pelvis:  Uterus and ovaries are not enlarged.  Surgical clips
consistent with tubal ligation.  Surgical absence of the appendix.
No evidence of diverticulitis.  No free or loculated pelvic fluid
collections.  No significant pelvic lymphadenopathy.  Normal
alignment of the lumbar vertebrae.
IMPRESSION: 2 mm stone in the distal right ureter without significant proximal
obstruction.  Nonobstructing punctate sized stone in the lower pole
right kidney.
